# Patient Record
Sex: Female | Born: 1935
Health system: Southern US, Community
[De-identification: ages and names within clinical notes are randomized; demographics above are authoritative.]

## PROBLEM LIST (undated history)

## (undated) DIAGNOSIS — K219 Gastro-esophageal reflux disease without esophagitis: Secondary | ICD-10-CM

## (undated) DIAGNOSIS — E079 Disorder of thyroid, unspecified: Secondary | ICD-10-CM

## (undated) DIAGNOSIS — I10 Essential (primary) hypertension: Secondary | ICD-10-CM

## (undated) DIAGNOSIS — M1990 Unspecified osteoarthritis, unspecified site: Secondary | ICD-10-CM

## (undated) DIAGNOSIS — E785 Hyperlipidemia, unspecified: Secondary | ICD-10-CM

## (undated) HISTORY — DX: Gastro-esophageal reflux disease without esophagitis: K21.9

## (undated) HISTORY — PX: OTHER SURGICAL HISTORY: SHX169

## (undated) HISTORY — DX: Unspecified osteoarthritis, unspecified site: M19.90

## (undated) HISTORY — DX: Disorder of thyroid, unspecified: E07.9

## (undated) HISTORY — DX: Hyperlipidemia, unspecified: E78.5

## (undated) HISTORY — DX: Essential (primary) hypertension: I10

---

## 2001-01-21 ENCOUNTER — Encounter: Admission: RE | Admit: 2001-01-21 | Discharge: 2001-01-21 | Payer: Self-pay | Admitting: Internal Medicine

## 2001-01-21 ENCOUNTER — Encounter: Payer: Self-pay | Admitting: Internal Medicine

## 2001-12-22 ENCOUNTER — Encounter: Payer: Self-pay | Admitting: Internal Medicine

## 2001-12-22 ENCOUNTER — Encounter: Admission: RE | Admit: 2001-12-22 | Discharge: 2001-12-22 | Payer: Self-pay | Admitting: Internal Medicine

## 2006-01-08 ENCOUNTER — Encounter: Admission: RE | Admit: 2006-01-08 | Discharge: 2006-01-08 | Payer: Self-pay | Admitting: Internal Medicine

## 2007-06-09 ENCOUNTER — Encounter: Admission: RE | Admit: 2007-06-09 | Discharge: 2007-06-09 | Payer: Self-pay | Admitting: Internal Medicine

## 2007-09-02 ENCOUNTER — Encounter: Admission: RE | Admit: 2007-09-02 | Discharge: 2007-09-02 | Payer: Self-pay | Admitting: Internal Medicine

## 2008-11-29 ENCOUNTER — Ambulatory Visit: Payer: Self-pay | Admitting: Vascular Surgery

## 2010-02-24 ENCOUNTER — Encounter: Payer: Self-pay | Admitting: Internal Medicine

## 2012-12-08 ENCOUNTER — Ambulatory Visit (INDEPENDENT_AMBULATORY_CARE_PROVIDER_SITE_OTHER): Payer: Medicare Other | Admitting: Podiatrist

## 2012-12-08 ENCOUNTER — Encounter: Payer: Self-pay | Admitting: Podiatrist

## 2012-12-08 DIAGNOSIS — M79609 Pain in unspecified limb: Secondary | ICD-10-CM

## 2012-12-08 DIAGNOSIS — B351 Tinea unguium: Secondary | ICD-10-CM

## 2012-12-08 NOTE — Patient Instructions (Signed)
GENERAL FOOT HEALTH INFORMATION:  Moisturize your feet regularly with a cream based lotion such as Cetaphil (Cream) or Eucerin (Cream)- usually available in a tub or crock type of container.  Avoid applying the cream to the toe interspaces themselves to reduce the risk of a fungal infection between the toes.  After showering or bathing be sure to dry well between your toes.    Wear a good fitting shoe-  Running shoe brands I recommend are Brooks, New Balance and [employer]iics.  Always have a shoe fit specialist help you choose your shoes as there are many "varieties" of shoes and they can find you the best fit.  [employer]/ [employer] ([place], [place], ), [employer], [employer] ([place]) have trained staff to help you in this process.  The [employer] in [place] has a great selection of euro-comfort casual shoes with good comfort and support as well.    Watch your toenails for any signs of infection including drainage, pus redness or swelling along the sides of the toenails.  Soak in epsom salt water and use antibiotic ointment (OTC) if you notice this start to occur.  If the redness does not resolve within 2-3 days, call for an appointment to be seen.  

## 2012-12-08 NOTE — Progress Notes (Signed)
  HPI:  Patient presents today for follow up of foot and nail care. Denies any new complaints today. Requests new buttress pads for each foot  Objective:  Patients chart is reviewed.  Neurovascular status unchanged.  Patients nails are thickened, discolored, distrophic, friable and brittle with yellow-brown discoloration. Patient subjectively relates they are painful with shoes and with ambulation.both feet.  Great toenails are incurvated but not infected.    Assessment:  Symptomatic onychomycosis  Plan:  Discussed treatment options and alternatives.  The symptomatic toenails were debrided through manual an mechanical means without complication.  Return appointment recommended at routine intervals of 3 months.  Slant back procedures performed without anesthesia on bilateral halluces.  Marlowe Aschoff DPM

## 2013-03-09 ENCOUNTER — Encounter: Payer: Self-pay | Admitting: Podiatrist

## 2013-03-09 ENCOUNTER — Ambulatory Visit (INDEPENDENT_AMBULATORY_CARE_PROVIDER_SITE_OTHER): Payer: Medicare Other | Admitting: Podiatrist

## 2013-03-09 VITALS — BP 134/70 | HR 74 | Resp 16

## 2013-03-09 DIAGNOSIS — M79609 Pain in unspecified limb: Secondary | ICD-10-CM

## 2013-03-09 DIAGNOSIS — L600 Ingrowing nail: Secondary | ICD-10-CM

## 2013-03-09 DIAGNOSIS — B351 Tinea unguium: Secondary | ICD-10-CM

## 2013-03-09 NOTE — Patient Instructions (Signed)
SAS shoes are probably your best bet for good, comfortable shoes-- you can also wear your sandals at home.  The shoe market has the largest selection of comfortable shoes you may wish to try.

## 2013-03-09 NOTE — Progress Notes (Signed)
   HPI:  Patient presents today for follow up of foot and nail care. Complains of generalized arthritis type pain.  ulcer has completely healed at the distal digit right foot.    Objective:  Patients chart is reviewed.  Vascular status reveals pedal pulses noted at 1 out of 4 dp and pt bilateral .  Neurological sensation is Decreased to [facility]il[facility] bilateral.  Patients nails are thickened, discolored, distrophic, friable and brittle with yellow-brown discoloration. Patient subjectively relates they are painful with shoes and with ambulation of bilateral feet. Contracture of all digits is noted. Bilateral hallux nails are incurvated and ingrown medial and lateral nail borders, generalized arthritic discomfort is present.  Assessment:  Symptomatic onychomycosis on the hammertoe contracture bilateral, healed ulcer, arthritic pain  Plan:  Discussed treatment options and alternatives.  The symptomatic toenails were debrided through manual an mechanical means without complication. Dispensed a buttress pads for bilateral feet, recommended Voltaren gel, Return appointment recommended at routine intervals of 3 months    Marlowe AschoffKathryn Alwaleed Obeso, DPM

## 2013-06-09 ENCOUNTER — Ambulatory Visit (INDEPENDENT_AMBULATORY_CARE_PROVIDER_SITE_OTHER): Payer: Medicare Other | Admitting: Podiatrist

## 2013-06-09 DIAGNOSIS — L600 Ingrowing nail: Secondary | ICD-10-CM

## 2013-06-09 DIAGNOSIS — M79609 Pain in unspecified limb: Secondary | ICD-10-CM

## 2013-06-09 DIAGNOSIS — B351 Tinea unguium: Secondary | ICD-10-CM

## 2013-06-09 NOTE — Progress Notes (Signed)
HPI: Patient presents today for follow up of foot and nail care. Generalized arthritic type pain still present.  Objective: Patients chart is reviewed. Vascular status reveals pedal pulses noted at 1 out of 4 dp and pt bilateral . Neurological sensation is Decreased to [facility] monofilament bilateral. Patients nails are thickened, discolored, distrophic, friable and brittle with yellow-brown discoloration. Patient subjectively relates they are painful with shoes and with ambulation of bilateral feet. Contracture of all digits is noted. Bilateral hallux nails are incurvated and ingrown medial and lateral nail borders, generalized arthritic discomfort is present.  Assessment: Symptomatic onychomycosis on the hammertoe contracture bilateral,  arthritic pain  Plan: Discussed treatment options and alternatives. The symptomatic toenails were debrided through manual an mechanical means without complication.  Return appointment recommended at routine intervals of 3 months

## 2013-06-09 NOTE — Patient Instructions (Signed)

## 2013-09-15 ENCOUNTER — Ambulatory Visit (INDEPENDENT_AMBULATORY_CARE_PROVIDER_SITE_OTHER): Payer: Medicare Other | Admitting: Podiatrist

## 2013-09-15 DIAGNOSIS — M79609 Pain in unspecified limb: Secondary | ICD-10-CM

## 2013-09-15 DIAGNOSIS — M79676 Pain in unspecified toe(s): Principal | ICD-10-CM

## 2013-09-15 DIAGNOSIS — M2040 Other hammer toe(s) (acquired), unspecified foot: Secondary | ICD-10-CM

## 2013-09-15 DIAGNOSIS — L84 Corns and callosities: Secondary | ICD-10-CM

## 2013-09-15 DIAGNOSIS — B351 Tinea unguium: Secondary | ICD-10-CM

## 2013-09-15 NOTE — Progress Notes (Signed)
   HPI: Patient presents today for follow up of foot and nail care. Generalized arthritic type pain still present. Calluses at the tip of the left fourth and right third toe are present and symptomatic.  Objective: Patients chart is reviewed. Vascular status reveals pedal pulses noted at 1 out of 4 dp and pt bilateral . Neurological sensation is Decreased to [facility] monofilament bilateral. Patients nails are thickened, discolored, distrophic, friable and brittle with yellow-brown discoloration. Patient subjectively relates they are painful with shoes and with ambulation of bilateral feet. Contracture of all digits is noted. Bilateral hallux nails are incurvated and ingrown medial and lateral nail borders, generalized arthritic discomfort is present. Callouses present at the distal tip of the left fourth and right third toe. Hammertoe contracture is present bilateral.  Assessment: Symptomatic onychomycosis on the hammertoe contracture bilateral, arthritic pain   Plan: Discussed treatment options and alternatives. The symptomatic toenails were debrided through manual an mechanical means without complication. Debridement of the calluses at the tips of the toe was accomplished. Dispensed tube foam and buttress pads to help lift the toes off the ground. Return appointment recommended at routine intervals of 3 months

## 2013-12-15 ENCOUNTER — Ambulatory Visit (INDEPENDENT_AMBULATORY_CARE_PROVIDER_SITE_OTHER): Payer: Medicare Other | Admitting: Podiatrist

## 2013-12-15 DIAGNOSIS — M79676 Pain in unspecified toe(s): Secondary | ICD-10-CM

## 2013-12-15 DIAGNOSIS — B351 Tinea unguium: Secondary | ICD-10-CM

## 2013-12-15 NOTE — Progress Notes (Signed)
   HPI: Patient presents today for follow up of foot and nail care. Generalized arthritic type pain still present. Calluses at the tip of the left fourth and right third toe are present and symptomatic.  Objective: Patients chart is reviewed. Vascular status reveals pedal pulses noted at 1 out of 4 dp and pt bilateral . Neurological sensation is Decreased to [facility] monofilament bilateral. Patients nails are thickened, discolored, distrophic, friable and brittle with yellow-brown discoloration. Patient subjectively relates they are painful with shoes and with ambulation of bilateral feet. Contracture of all digits is noted. Bilateral hallux nails are incurvated and ingrown medial and lateral nail borders, generalized arthritic discomfort is present. Callouses present at the distal tip of the left fourth and right third toe. Hammertoe contracture is present bilateral.  Assessment: Symptomatic onychomycosis on the hammertoe contracture bilateral, arthritic pain   Plan: Discussed treatment options and alternatives. The symptomatic toenails were debrided through manual an mechanical means without complication. Calluses at the tips of the toe are much improved with the use of the leather buttress pads.  We are out of leather pads today and silicone ones are dispensed.  There is a discrepancy on the price of the pads therefore they were given to her as a courtesy. Return appointment recommended at routine intervals of 3 months

## 2014-03-16 ENCOUNTER — Encounter: Payer: Self-pay | Admitting: Podiatrist

## 2014-03-16 ENCOUNTER — Ambulatory Visit (INDEPENDENT_AMBULATORY_CARE_PROVIDER_SITE_OTHER): Payer: Medicare Other | Admitting: Podiatrist

## 2014-03-16 DIAGNOSIS — M79676 Pain in unspecified toe(s): Secondary | ICD-10-CM

## 2014-03-16 DIAGNOSIS — B351 Tinea unguium: Secondary | ICD-10-CM

## 2014-03-16 NOTE — Progress Notes (Signed)
   HPI: Patient presents today for follow up of foot and nail care. Generalized arthritic type pain still present. Calluses at the tip of the left fourth and right third toe are present and asymptomatic today.  Objective: Patients chart is reviewed. Vascular status reveals pedal pulses noted at 1 out of 4 dp and pt bilateral . Neurological sensation is Decreased to Semmes Weinstein monofilament bilateral. Patients nails are thickened, discolored, distrophic, friable and brittle with yellow-brown discoloration. Patient subjectively relates they are painful with shoes and with ambulation of bilateral feet. Contracture of all digits is noted. Bilateral hallux nails are incurvated and ingrown medial and lateral nail borders, generalized arthritic discomfort is present. Callouses present at the distal tip of the left fourth and right third toe. Hammertoe contracture is present bilateral.  Assessment: Symptomatic onychomycosis on the hammertoe contracture bilateral, arthritic pain   Plan: Discussed treatment options and alternatives. The symptomatic toenails were debrided through manual an mechanical means without complication. Calluses at the tips of the toe are much improved  Return appointment recommended at routine intervals of 3 months   

## 2014-06-15 ENCOUNTER — Ambulatory Visit: Payer: Medicare Other | Admitting: Podiatrist

## 2014-06-21 ENCOUNTER — Ambulatory Visit (INDEPENDENT_AMBULATORY_CARE_PROVIDER_SITE_OTHER): Payer: Medicare Other | Admitting: Podiatrist

## 2014-06-21 ENCOUNTER — Encounter: Payer: Self-pay | Admitting: Podiatrist

## 2014-06-21 VITALS — BP 126/54 | HR 75 | Resp 16

## 2014-06-21 DIAGNOSIS — M79676 Pain in unspecified toe(s): Secondary | ICD-10-CM

## 2014-06-21 DIAGNOSIS — B351 Tinea unguium: Secondary | ICD-10-CM | POA: Diagnosis not present

## 2014-06-23 NOTE — Progress Notes (Signed)
   HPI: Patient presents today for follow up of foot and nail care. Generalized arthritic type pain still present. Calluses at the tip of the left fourth and right third toe are present and asymptomatic today.  Objective: Patients chart is reviewed. Vascular status reveals pedal pulses noted at 1 out of 4 dp and pt bilateral . Neurological sensation is Decreased to [facility] monofilament bilateral. Patients nails are thickened, discolored, distrophic, friable and brittle with yellow-brown discoloration. Patient subjectively relates they are painful with shoes and with ambulation of bilateral feet. Contracture of all digits is noted. Bilateral hallux nails are incurvated and ingrown medial and lateral nail borders, generalized arthritic discomfort is present. Callouses present at the distal tip of the left fourth and right third toe. Hammertoe contracture is present bilateral.  Assessment: Symptomatic onychomycosis on the hammertoe contracture bilateral, arthritic pain   Plan: Discussed treatment options and alternatives. The symptomatic toenails were debrided through manual an mechanical means without complication. Calluses at the tips of the toe are much improved  Return appointment recommended at routine intervals of 3 months

## 2014-10-17 ENCOUNTER — Ambulatory Visit: Payer: Medicare Other | Admitting: Podiatrist

## 2014-10-20 ENCOUNTER — Encounter: Payer: Self-pay | Admitting: Podiatry

## 2014-10-20 ENCOUNTER — Ambulatory Visit (INDEPENDENT_AMBULATORY_CARE_PROVIDER_SITE_OTHER): Payer: Medicare Other | Admitting: Podiatry

## 2014-10-20 DIAGNOSIS — B351 Tinea unguium: Secondary | ICD-10-CM | POA: Diagnosis not present

## 2014-10-20 DIAGNOSIS — M79676 Pain in unspecified toe(s): Secondary | ICD-10-CM

## 2014-10-20 NOTE — Progress Notes (Signed)
Patient ID: Diana Peck, female   DOB: April 23, 1918, 81 y.o.   MRN: [id number] Complaint:  Visit Type: Patient returns to my office for continued preventative foot care services. Complaint: Patient states" my nails have grown long and thick and become painful to walk and wear shoes" . The patient presents for preventative foot care services. No changes to ROS  Podiatric Exam: Vascular: dorsalis pedis and posterior tibial pulses are palpable bilateral. Capillary return is immediate. Temperature gradient is WNL. Skin turgor WNL  Sensorium: Normal Semmes Weinstein monofilament test. Normal tactile sensation bilaterally. Nail Exam: Pt has thick disfigured discolored nails with subungual debris noted bilateral entire nail hallux through fifth toenails Ulcer Exam: There is no evidence of ulcer or pre-ulcerative changes or infection. Orthopedic Exam: Muscle tone and strength are WNL. No limitations in general ROM. No crepitus or effusions noted. Foot type and digits show no abnormalities. Bony prominences are unremarkable. Hammer toe contracture both feet. Skin: No Porokeratosis. No infection or ulcers  Diagnosis:  Onychomycosis, , Pain in right toe, pain in left toes  Treatment & Plan Procedures and Treatment: Consent by patient was obtained for treatment procedures. The patient understood the discussion of treatment and procedures well. All questions were answered thoroughly reviewed. Debridement of mycotic and hypertrophic toenails, 1 through 5 bilateral and clearing of subungual debris. No ulceration, no infection noted.  Return Visit-Office Procedure: Patient instructed to return to the office for a follow up visit 3 months for continued evaluation and treatment.

## 2015-01-11 ENCOUNTER — Ambulatory Visit (INDEPENDENT_AMBULATORY_CARE_PROVIDER_SITE_OTHER): Payer: Medicare Other | Admitting: Podiatry

## 2015-01-11 ENCOUNTER — Encounter: Payer: Self-pay | Admitting: Podiatry

## 2015-01-11 DIAGNOSIS — B351 Tinea unguium: Secondary | ICD-10-CM | POA: Diagnosis not present

## 2015-01-11 DIAGNOSIS — M79676 Pain in unspecified toe(s): Secondary | ICD-10-CM

## 2015-01-11 DIAGNOSIS — M2040 Other hammer toe(s) (acquired), unspecified foot: Secondary | ICD-10-CM

## 2015-01-11 NOTE — Progress Notes (Signed)
Patient ID: Diana Peck R Ebel, female   DOB: 07/21/1918, 81 y.o.   MRN: [id number] Complaint:  Visit Type: Patient returns to my office for continued preventative foot care services. Complaint: Patient states" my nails have grown long and thick and become painful to walk and wear shoes" . The patient presents for preventative foot care services. No changes to ROS  Podiatric Exam: Vascular: dorsalis pedis and posterior tibial pulses are palpable bilateral. Capillary return is immediate. Temperature gradient is WNL. Skin turgor WNL  Sensorium: Normal Semmes Weinstein monofilament test. Normal tactile sensation bilaterally. Nail Exam: Pt has thick disfigured discolored nails with subungual debris noted bilateral entire nail hallux through fifth toenails Ulcer Exam: There is no evidence of ulcer or pre-ulcerative changes or infection. Orthopedic Exam: Muscle tone and strength are WNL. No limitations in general ROM. No crepitus or effusions noted. Foot type and digits show no abnormalities. Bony prominences are unremarkable. Hammer toe contracture both feet. Skin: No Porokeratosis. No infection or ulcers  Diagnosis:  Onychomycosis, , Pain in right toe, pain in left toes  Treatment & Plan Procedures and Treatment: Consent by patient was obtained for treatment procedures. The patient understood the discussion of treatment and procedures well. All questions were answered thoroughly reviewed. Debridement of mycotic and hypertrophic toenails, 1 through 5 bilateral and clearing of subungual debris. No ulceration, no infection noted.  Return Visit-Office Procedure: Patient instructed to return to the office for a follow up visit 3 months for continued evaluation and treatment.  Helane GuntherGregory Olivianna Higley DPM

## 2015-04-12 ENCOUNTER — Ambulatory Visit: Payer: Medicare Other | Admitting: Podiatry

## 2015-07-25 ENCOUNTER — Emergency Department (HOSPITAL_BASED_OUTPATIENT_CLINIC_OR_DEPARTMENT_OTHER)
Admission: EM | Admit: 2015-07-25 | Discharge: 2015-07-25 | Disposition: A | Discharge status: Home / Self Care | Payer: Medicare Other | Attending: Emergency Medicine | Admitting: Emergency Medicine

## 2015-07-25 ENCOUNTER — Emergency Department (HOSPITAL_BASED_OUTPATIENT_CLINIC_OR_DEPARTMENT_OTHER): Payer: Medicare Other

## 2015-07-25 DIAGNOSIS — M25551 Pain in right hip: Secondary | ICD-10-CM | POA: Diagnosis present

## 2015-07-25 DIAGNOSIS — Z79899 Other long term (current) drug therapy: Secondary | ICD-10-CM | POA: Insufficient documentation

## 2015-07-25 DIAGNOSIS — Z791 Long term (current) use of non-steroidal anti-inflammatories (NSAID): Secondary | ICD-10-CM | POA: Insufficient documentation

## 2015-07-25 DIAGNOSIS — E785 Hyperlipidemia, unspecified: Secondary | ICD-10-CM | POA: Insufficient documentation

## 2015-07-25 DIAGNOSIS — M1990 Unspecified osteoarthritis, unspecified site: Secondary | ICD-10-CM | POA: Diagnosis not present

## 2015-07-25 DIAGNOSIS — I10 Essential (primary) hypertension: Secondary | ICD-10-CM | POA: Diagnosis not present

## 2015-07-25 DIAGNOSIS — N390 Urinary tract infection, site not specified: Secondary | ICD-10-CM | POA: Insufficient documentation

## 2015-07-25 DIAGNOSIS — R0602 Shortness of breath: Secondary | ICD-10-CM | POA: Insufficient documentation

## 2015-07-25 LAB — BASIC METABOLIC PANEL
ANION GAP: 9 (ref 5–15)
BUN: 40 mg/dL — ABNORMAL HIGH (ref 6–20)
CALCIUM: 8.6 mg/dL — AB (ref 8.9–10.3)
CO2: 23 mmol/L (ref 22–32)
Chloride: 108 mmol/L (ref 101–111)
Creatinine, Ser: 1.31 mg/dL — ABNORMAL HIGH (ref 0.44–1.00)
GFR calc non Af Amer: 34 mL/min — ABNORMAL LOW (ref >60)
GFR, EST AFRICAN AMERICAN: 39 mL/min — AB (ref >60)
GLUCOSE: 95 mg/dL (ref 65–99)
POTASSIUM: 4 mmol/L (ref 3.5–5.1)
Sodium: 140 mmol/L (ref 135–145)

## 2015-07-25 LAB — CBC WITH DIFFERENTIAL/PLATELET
BASOS ABS: 0 10*3/uL (ref 0.0–0.1)
BASOS PCT: 0 %
EOS PCT: 1 %
Eosinophils Absolute: 0.1 10*3/uL (ref 0.0–0.7)
HEMATOCRIT: 38.5 % (ref 36.0–46.0)
Hemoglobin: 12.5 g/dL (ref 12.0–15.0)
LYMPHS PCT: 21 %
Lymphs Abs: 2.5 10*3/uL (ref 0.7–4.0)
MCH: 30.3 pg (ref 26.0–34.0)
MCHC: 32.5 g/dL (ref 30.0–36.0)
MCV: 93.4 fL (ref 78.0–100.0)
MONOS PCT: 5 %
Monocytes Absolute: 0.6 10*3/uL (ref 0.1–1.0)
NEUTROS ABS: 8.7 10*3/uL — AB (ref 1.7–7.7)
NEUTROS PCT: 73 %
PLATELETS: 182 10*3/uL (ref 150–400)
RBC: 4.12 MIL/uL (ref 3.87–5.11)
RDW: 16 % — AB (ref 11.5–15.5)
WBC: 11.9 10*3/uL — ABNORMAL HIGH (ref 4.0–10.5)

## 2015-07-25 LAB — URINALYSIS, ROUTINE W REFLEX MICROSCOPIC
Bilirubin Urine: NEGATIVE
Glucose, UA: NEGATIVE mg/dL
Hgb urine dipstick: NEGATIVE
KETONES UR: NEGATIVE mg/dL
NITRITE: NEGATIVE
PROTEIN: NEGATIVE mg/dL
Specific Gravity, Urine: 1.022 (ref 1.005–1.030)
pH: 5.5 (ref 5.0–8.0)

## 2015-07-25 LAB — URINE MICROSCOPIC-ADD ON

## 2015-07-25 MED ORDER — ONDANSETRON 8 MG PO TBDP
8.0000 mg | ORAL_TABLET | Freq: Once | ORAL | Status: AC
Start: 2015-07-25 — End: 2015-07-25
  Administered 2015-07-25: 8 mg via ORAL
  Filled 2015-07-25: qty 1

## 2015-07-25 MED ORDER — HYDROCODONE-ACETAMINOPHEN 5-325 MG PO TABS
0.5000 | ORAL_TABLET | Freq: Once | ORAL | Status: AC
  Administered 2015-07-25: 0.5 via ORAL
  Filled 2015-07-25: qty 1

## 2015-07-25 MED ORDER — CEPHALEXIN 500 MG PO CAPS: 500.0000 mg | ORAL_CAPSULE | Freq: Two times a day (BID) | ORAL | Status: DC

## 2015-07-25 MED ORDER — ONDANSETRON 4 MG PO TBDP: 4.0000 mg | ORAL_TABLET | Freq: Three times a day (TID) | ORAL | Status: AC | PRN

## 2015-07-25 MED ORDER — HYDROCODONE-ACETAMINOPHEN 5-325 MG PO TABS
0.5000 | ORAL_TABLET | Freq: Every evening | ORAL | Status: AC | PRN
Start: 2015-07-25 — End: ?

## 2015-07-25 MED FILL — CEPHALEXIN 500 MG CAPSULE: 500 | 7 days supply | Qty: 14 | Fill #0

## 2015-07-25 MED FILL — ONDANSETRON ODT 4 MG TABLET: 4 | 2 days supply | Qty: 4 | Fill #0

## 2015-07-25 MED FILL — HYDROCODON-APAP 5-325: 5-325 | 10 days supply | Qty: 5 | Fill #0

## 2015-07-25 NOTE — ED Notes (Addendum)
Per granddaughter-patient c/o chronic right hip pain for months but states worsening over the past 2 weeks.  Patients right leg noted to be longer than the left.  Per granddaughter no falls in the past few weeks.  Patient currently ambulates with cane.  Per daughter patient did fall on the left leg years ago and has been limping for "a long time".

## 2015-07-25 NOTE — ED Notes (Signed)
Pt EMS transport from home. C/o right hip pain for "weeks". Pain is worsening. Denies falls or injury

## 2015-07-25 NOTE — ED Notes (Signed)
MD at bedside. 

## 2015-07-25 NOTE — Discharge Instructions (Signed)
  Arthritis Arthritis means joint pain. It can also mean joint disease. A joint is a place where bones come together. People who have arthritis may have:  Red joints.  Swollen joints.  Stiff joints.  Warm joints.  A fever.  A feeling of being sick. HOME CARE Pay attention to any changes in your symptoms. Take these actions to help with your pain and swelling. Medicines  Take over-the-counter and prescription medicines only as told by your doctor.  Do not take aspirin for pain if your doctor says that you may have gout. Activities  Rest your joint if your doctor tells you to.  Avoid activities that make the pain worse.  Exercise your joint regularly as told by your doctor. Try doing exercises like:  Swimming.  Water aerobics.  Biking.  Walking. Joint Care  If your joint is swollen, keep it raised (elevated) if told by your doctor.  If your joint feels stiff in the morning, try taking a warm shower.  If you have diabetes, do not apply heat without asking your doctor.  If told, apply heat to the joint:  Put a towel between the joint and the hot pack or heating pad.  Leave the heat on the area for 20-30 minutes.  If told, apply ice to the joint:  Put ice in a plastic bag.  Place a towel between your skin and the bag.  Leave the ice on for 20 minutes, 2-3 times per day.  Keep all follow-up visits as told by your doctor. GET HELP IF:  The pain gets worse.  You have a fever. GET HELP RIGHT AWAY IF:  You have very bad pain in your joint.  You have swelling in your joint.  Your joint is red.  Many joints become painful and swollen.  You have very bad back pain.  Your leg is very weak.  You cannot control your pee (urine) or poop (stool).   This information is not intended to replace advice given to you by your health care provider. Make sure you discuss any questions you have with your health care provider.   Document Released: 04/16/2009  Document Revised: 10/11/2014 Document Reviewed: 04/17/2014 Elsevier Interactive Patient Education 2016 [employer]  

## 2015-07-25 NOTE — ED Notes (Signed)
Patient states unable to provide urine sample at present.  Patient given water to drink.  MD aware.

## 2015-07-25 NOTE — ED Provider Notes (Signed)
[facility]   Zadie Rhineonald Aarna Mihalko, MD 07/25/15 1556

## 2015-07-25 NOTE — ED Notes (Signed)
Pt stated she felt fine walking around the department, but seemed she was short of breath.

## 2015-07-25 NOTE — ED Provider Notes (Signed)
CSN: [id number]     Arrival date & time 07/25/15  1323 History   First MD Initiated Contact with Patient 07/25/15 1340     Chief Complaint  Patient presents with  . Hip Pain    Patient is a 82 y.o. female presenting with hip pain. The history is provided by the patient and a relative.  Hip Pain This is a recurrent problem. The current episode started more than 1 week ago. The problem occurs daily. The problem has been gradually worsening. Associated symptoms include shortness of breath. Pertinent negatives include no chest pain and no abdominal pain. The symptoms are aggravated by walking. The symptoms are relieved by rest.  Patient presents with recurrent right hip pain This has been present for "awhile" - she reports h/o arthritis No falls/trauma She reports hip pain is worsening over past 2 weeks She also reports low back pain as well She also reports some cough/wheeze/sob No abd pain No CP No dysuria but she reports urinary frequency   Past Medical History  Diagnosis Date  . Arthritis   . GERD (gastroesophageal reflux disease)   . Thyroid disease   . Hyperlipidemia   . Hypertension    Past Surgical History  Procedure Laterality Date  . Tonsills     No family history on file. Social History  Substance Use Topics  . Smoking status: Never Smoker   . Smokeless tobacco: Former Neurosurgeon    Types: Snuff  . Alcohol Use: No   OB History    No data available     Review of Systems  Constitutional: Negative for fever.  Respiratory: Positive for cough and shortness of breath.   Cardiovascular: Negative for chest pain.  Gastrointestinal: Negative for abdominal pain.  Musculoskeletal: Positive for back pain and arthralgias.  All other systems reviewed and are negative.     Allergies  Codeine; Detrol; and E-mycin  Home Medications   Prior to Admission medications   Medication Sig Start Date End Date Taking? Authorizing Provider  diclofenac (VOLTAREN) 75 MG EC tablet  Take 75 mg by mouth 2 (two) times daily.   Yes Historical Provider, MD  diclofenac sodium (VOLTAREN) 1 % GEL Apply topically 4 (four) times daily.   Yes Historical Provider, MD  esomeprazole (NEXIUM) 40 MG capsule Take 40 mg by mouth daily at 12 noon.   Yes Historical Provider, MD  levothyroxine (SYNTHROID, LEVOTHROID) 50 MCG tablet  09/14/12  Yes Historical Provider, MD  LIPITOR 10 MG tablet  09/14/12  Yes Historical Provider, MD  triamcinolone cream (KENALOG) 0.1 % Apply 1 application topically 2 (two) times daily.   Yes Historical Provider, MD  triamterene-hydrochlorothiazide (MAXZIDE) 75-50 MG per tablet Take 1 tablet by mouth daily.   Yes Historical Provider, MD  Multiple Vitamin (MULTIVITAMIN) capsule Take 1 capsule by mouth daily.    Historical Provider, MD   BP 132/52 mmHg  Pulse 72  Temp(Src) 98 F (36.7 C) (Oral)  Resp 18  Ht  (1.575 m)  Wt 61.236 kg  BMI 24.69 kg/m2  SpO2 97% Physical Exam CONSTITUTIONAL: Frail, elderly, no acute distress HEAD: Normocephalic/atraumatic EYES: EOMI ENMT: Mucous membranes moist NECK: supple no meningeal signs SPINE/BACK:Kyphotic spine, mild paraspinal lumbar tenderness CV: S1/S2 noted LUNGS: Lungs are clear to auscultation bilaterally, no apparent distress ABDOMEN: soft, nontender GU:no cva tenderness NEURO: Pt is awake/alert/appropriate, moves all extremitiesx4.  No facial droop.   EXTREMITIES: pulses normal/equal, full ROM, mild tenderness to palpation of right hip.  No deformity noted. SKIN:  warm, color normal PSYCH: no abnormalities of mood noted, alert and oriented to situation  ED Course  Procedures   3:48 PM Pt improved She is ambulatory at baseline Imaging negative for fracture No falls reported She appears at baseline Discussed CXR findings with patient/family - findings could be from hiatal hernia but would need further definitive imaging.  She has no dysphagia or issues eating, will f/u with dr Su Hilt if any dysphagia  occurs Awaiting urinalysis and then will d/c home She did well with half tablet of vicodin will give as a prescription and need to stop volateran as mild renal insufficiency noted  Labs Review Labs Reviewed  BASIC METABOLIC PANEL - Abnormal; Notable for the following:    BUN 40 (*)    Creatinine, Ser 1.31 (*)    Calcium 8.6 (*)    GFR calc non Af Amer 34 (*)    GFR calc Af Amer 39 (*)    All other components within normal limits  CBC WITH DIFFERENTIAL/PLATELET - Abnormal; Notable for the following:    WBC 11.9 (*)    RDW 16.0 (*)    Neutro Abs 8.7 (*)    All other components within normal limits  URINALYSIS, ROUTINE W REFLEX MICROSCOPIC (NOT AT [facility])    Imaging Review Dg Chest 2 View  07/25/2015  CLINICAL DATA:  Low back and right hip pain for 2 weeks. No known injury. EXAM: CHEST  2 VIEW COMPARISON:  Chest radiograph 09/02/2007. FINDINGS: The heart size and mediastinal contours are grossly stable. There is aortic atherosclerosis with a moderate size hiatal hernia. The esophagus is dilated with an air-fluid level superior to the aortic arch. There is mild chronic scarring or atelectasis at both lung bases. No edema, confluent airspace opacity or significant pleural effusion. There are degenerative changes throughout the thoracic spine with anterior wedging near the thoracolumbar junction, grossly stable. IMPRESSION: 1. Distended esophagus with proximal air-fluid level worrisome for esophageal obstruction or dysmotility. Patient does have a moderate size hiatal hernia. Consider CT for further evaluation. If the patient has dysphagia, barium swallow may be helpful. 2. Chronic bibasilar scarring or atelectasis. Electronically Signed   By: Carey Bullocks M.D.   On: 07/25/2015 15:01   Dg Lumbar Spine Complete  07/25/2015  CLINICAL DATA:  Status post fall 2 weeks ago. Low back pain. Initial encounter. EXAM: LUMBAR SPINE - COMPLETE 4+ VIEW COMPARISON:  None. FINDINGS: No acute abnormality is  seen. The patient has severe multilevel degenerative disc disease and facet arthropathy. Convex left scoliosis is noted. Aortic atherosclerosis is seen. IMPRESSION: No acute abnormality. Severe multilevel spondylosis. Atherosclerosis. Electronically Signed   By: Drusilla Kanner M.D.   On: 07/25/2015 14:58   Dg Hip Unilat With Pelvis 2-3 Views Right  07/25/2015  CLINICAL DATA:  Status post fall 2 weeks ago. Continued right hip pain. Initial encounter. EXAM: DG HIP (WITH OR WITHOUT PELVIS) 2-3V RIGHT COMPARISON:  None. FINDINGS: No acute bony or joint abnormality is identified. No notable degenerative disease about the hips. Lower lumbar spondylosis is seen. IMPRESSION: No acute abnormality. Lower lumbar spondylosis. Electronically Signed   By: Drusilla Kanner M.D.   On: 07/25/2015 14:56   I have personally reviewed and evaluated these images and lab results as part of my medical decision-making.   MDM   Final diagnoses:  Right hip pain  Arthritis    Nursing notes including past medical history and social history reviewed and considered in documentation Labs/vital reviewed myself and considered during evaluation  xrays/imaging reviewed by myself and considered during evaluation     Zadie Rhineonald Brenson Hartman, MD 07/25/15 1550

## 2015-07-27 LAB — URINE CULTURE

## 2015-07-28 ENCOUNTER — Telehealth (HOSPITAL_BASED_OUTPATIENT_CLINIC_OR_DEPARTMENT_OTHER): Payer: Self-pay

## 2015-07-28 NOTE — Telephone Encounter (Signed)
Post ED Visit - Positive Culture Follow-up  Culture report reviewed by antimicrobial stewardship pharmacist:  []  Enzo BiNathan Batchelder, Pharm.D. []  Celedonio MiyamotoJeremy Frens, Pharm.D., BCPS []  Garvin FilaMike Maccia, Pharm.D. []  Georgina PillionElizabeth Martin, Pharm.D., BCPS []  MissionMinh Pham, [address]Ph[address]D., BCPS, AAHIVP []  Estella HuskMichelle Turner, Pharm.D., BCPS, AAHIVP []  Tennis Mustassie Stewart, Pharm.D. []  Sherle Poeob Vincent, [address]Ph[address] Allsion Masters Pharm D Positive urine culture Treated with Cephalexin, organism sensitive to the same and no further patient follow-up is required at this time.  Jerry CarasCullom, Keithan Dileonardo Burnett 07/28/2015, 11:07 AM

## 2015-11-14 ENCOUNTER — Encounter: Payer: Self-pay | Admitting: Podiatry

## 2015-11-14 ENCOUNTER — Ambulatory Visit (INDEPENDENT_AMBULATORY_CARE_PROVIDER_SITE_OTHER): Payer: Medicare Other | Admitting: Podiatry

## 2015-11-14 VITALS — BP 138/77 | HR 81 | Resp 14

## 2015-11-14 DIAGNOSIS — B351 Tinea unguium: Secondary | ICD-10-CM

## 2015-11-14 DIAGNOSIS — M79676 Pain in unspecified toe(s): Secondary | ICD-10-CM | POA: Diagnosis not present

## 2015-11-14 NOTE — Progress Notes (Signed)
Patient ID: Diana Peck, female   DOB: 10/20/1918, 82 y.o.   MRN: [id number] Complaint:  Visit Type: Patient returns to my office for continued preventative foot care services. Complaint: Patient states" my nails have grown long and thick and become painful to walk and wear shoes" . The patient presents for preventative foot care services. No changes to ROS  Podiatric Exam: Vascular: dorsalis pedis and posterior tibial pulses are palpable bilateral. Capillary return is immediate. Temperature gradient is WNL. Skin turgor WNL  Sensorium: Normal Semmes Weinstein monofilament test. Normal tactile sensation bilaterally. Nail Exam: Pt has thick disfigured discolored nails with subungual debris noted bilateral entire nail hallux through fifth toenails Ulcer Exam: There is no evidence of ulcer or pre-ulcerative changes or infection. Orthopedic Exam: Muscle tone and strength are WNL. No limitations in general ROM. No crepitus or effusions noted. Foot type and digits show no abnormalities. Bony prominences are unremarkable. Hammer toe contracture both feet. Skin: No Porokeratosis. No infection or ulcers  Diagnosis:  Onychomycosis, , Pain in right toe, pain in left toes  Treatment & Plan Procedures and Treatment: Consent by patient was obtained for treatment procedures. The patient understood the discussion of treatment and procedures well. All questions were answered thoroughly reviewed. Debridement of mycotic and hypertrophic toenails, 1 through 5 bilateral and clearing of subungual debris. No ulceration, no infection noted.  Return Visit-Office Procedure: Patient instructed to return to the office for a follow up visit 3 months for continued evaluation and treatment.  Jaedan Schuman DPM 

## 2016-02-20 ENCOUNTER — Ambulatory Visit: Payer: Medicare Other | Admitting: Podiatry

## 2016-06-04 ENCOUNTER — Encounter: Payer: Self-pay | Admitting: Podiatry

## 2016-06-04 ENCOUNTER — Ambulatory Visit (INDEPENDENT_AMBULATORY_CARE_PROVIDER_SITE_OTHER): Payer: Medicare Other | Admitting: Podiatry

## 2016-06-04 DIAGNOSIS — M79676 Pain in unspecified toe(s): Secondary | ICD-10-CM | POA: Diagnosis not present

## 2016-06-04 DIAGNOSIS — B351 Tinea unguium: Secondary | ICD-10-CM

## 2016-06-04 NOTE — Progress Notes (Signed)
Patient ID: Diana Peck, female   DOB: 08/06/1918, 83 y.o.   MRN: [id number] Complaint:  Visit Type: Patient returns to my office for continued preventative foot care services. Complaint: Patient states" my nails have grown long and thick and become painful to walk and wear shoes" . The patient presents for preventative foot care services. No changes to ROS  Podiatric Exam: Vascular: dorsalis pedis and posterior tibial pulses are palpable bilateral. Capillary return is immediate. Temperature gradient is WNL. Skin turgor WNL  Sensorium: Normal Semmes Weinstein monofilament test. Normal tactile sensation bilaterally. Nail Exam: Pt has thick disfigured discolored nails with subungual debris noted bilateral entire nail hallux through fifth toenails Ulcer Exam: There is no evidence of ulcer or pre-ulcerative changes or infection. Orthopedic Exam: Muscle tone and strength are WNL. No limitations in general ROM. No crepitus or effusions noted. Foot type and digits show no abnormalities. Bony prominences are unremarkable. Hammer toe contracture both feet. Skin: No Porokeratosis. No infection or ulcers  Diagnosis:  Onychomycosis, , Pain in right toe, pain in left toes  Treatment & Plan Procedures and Treatment: Consent by patient was obtained for treatment procedures. The patient understood the discussion of treatment and procedures well. All questions were answered thoroughly reviewed. Debridement of mycotic and hypertrophic toenails, 1 through 5 bilateral and clearing of subungual debris. No ulceration, no infection noted.  Return Visit-Office Procedure: Patient instructed to return to the office for a follow up visit 3 months for continued evaluation and treatment.  Tuleen Mandelbaum DPM 

## 2016-09-10 ENCOUNTER — Ambulatory Visit (INDEPENDENT_AMBULATORY_CARE_PROVIDER_SITE_OTHER): Payer: Medicare Other | Admitting: Podiatry

## 2016-09-10 DIAGNOSIS — M79676 Pain in unspecified toe(s): Secondary | ICD-10-CM | POA: Diagnosis not present

## 2016-09-10 DIAGNOSIS — B351 Tinea unguium: Secondary | ICD-10-CM

## 2016-09-10 NOTE — Progress Notes (Signed)
Patient ID: Diana Peck, female   DOB: 03/19/1918, 83 y.o.   MRN: [id number] Complaint:  Visit Type: Patient returns to my office for continued preventative foot care services. Complaint: Patient states" my nails have grown long and thick and become painful to walk and wear shoes" . The patient presents for preventative foot care services. No changes to ROS  Podiatric Exam: Vascular: dorsalis pedis and posterior tibial pulses are palpable bilateral. Capillary return is immediate. Temperature gradient is WNL. Skin turgor WNL  Sensorium: Normal Semmes Weinstein monofilament test. Normal tactile sensation bilaterally. Nail Exam: Pt has thick disfigured discolored nails with subungual debris noted bilateral entire nail hallux through fifth toenails Ulcer Exam: There is no evidence of ulcer or pre-ulcerative changes or infection. Orthopedic Exam: Muscle tone and strength are WNL. No limitations in general ROM. No crepitus or effusions noted. Foot type and digits show no abnormalities. Bony prominences are unremarkable. Hammer toe contracture both feet. Skin: No Porokeratosis. No infection or ulcers  Diagnosis:  Onychomycosis, , Pain in right toe, pain in left toes  Treatment & Plan Procedures and Treatment: Consent by patient was obtained for treatment procedures. The patient understood the discussion of treatment and procedures well. All questions were answered thoroughly reviewed. Debridement of mycotic and hypertrophic toenails, 1 through 5 bilateral and clearing of subungual debris. No ulceration, no infection noted.  Return Visit-Office Procedure: Patient instructed to return to the office for a follow up visit 3 months for continued evaluation and treatment.  Dalon Reichart DPM 

## 2016-12-17 ENCOUNTER — Encounter: Payer: Self-pay | Admitting: Podiatry

## 2016-12-17 ENCOUNTER — Ambulatory Visit (INDEPENDENT_AMBULATORY_CARE_PROVIDER_SITE_OTHER): Payer: Medicare Other | Admitting: Podiatry

## 2016-12-17 DIAGNOSIS — M79676 Pain in unspecified toe(s): Secondary | ICD-10-CM

## 2016-12-17 DIAGNOSIS — B351 Tinea unguium: Secondary | ICD-10-CM

## 2016-12-17 NOTE — Progress Notes (Signed)
Patient ID: Diana Peck, female   DOB: 07-19-1918, 83 y.o.   MRN: [id number] Complaint:  Visit Type: Patient returns to my office for continued preventative foot care services. Complaint: Patient states" my nails have grown long and thick and become painful to walk and wear shoes" . The patient presents for preventative foot care services. No changes to ROS  Podiatric Exam: Vascular: dorsalis pedis and posterior tibial pulses are palpable bilateral. Capillary return is immediate. Temperature gradient is WNL. Skin turgor WNL  Sensorium: Normal Semmes Weinstein monofilament test. Normal tactile sensation bilaterally. Nail Exam: Pt has thick disfigured discolored nails with subungual debris noted bilateral entire nail hallux through fifth toenails Ulcer Exam: There is no evidence of ulcer or pre-ulcerative changes or infection. Orthopedic Exam: Muscle tone and strength are WNL. No limitations in general ROM. No crepitus or effusions noted. Foot type and digits show no abnormalities. Bony prominences are unremarkable. Hammer toe contracture both feet. Skin: No Porokeratosis. No infection or ulcers  Diagnosis:  Onychomycosis, , Pain in right toe, pain in left toes  Treatment & Plan Procedures and Treatment: Consent by patient was obtained for treatment procedures. The patient understood the discussion of treatment and procedures well. All questions were answered thoroughly reviewed. Debridement of mycotic and hypertrophic toenails, 1 through 5 bilateral and clearing of subungual debris. No ulceration, no infection noted.  Return Visit-Office Procedure: Patient instructed to return to the office for a follow up visit 3 months for continued evaluation and treatment.  Helane GuntherGregory Elizet Kaplan DPM

## 2017-04-01 ENCOUNTER — Encounter: Payer: Self-pay | Admitting: Podiatry

## 2017-04-01 ENCOUNTER — Ambulatory Visit (INDEPENDENT_AMBULATORY_CARE_PROVIDER_SITE_OTHER): Payer: Medicare Other | Admitting: Podiatry

## 2017-04-01 DIAGNOSIS — B351 Tinea unguium: Secondary | ICD-10-CM | POA: Diagnosis not present

## 2017-04-01 DIAGNOSIS — M79676 Pain in unspecified toe(s): Secondary | ICD-10-CM

## 2017-04-01 NOTE — Progress Notes (Signed)
Patient ID: Diana Peck, female   DOB: 07/03/1918, 84 y.o.   MRN: [id number] Complaint:  Visit Type: Patient returns to my office for continued preventative foot care services. Complaint: Patient states" my nails have grown long and thick and become painful to walk and wear shoes" . The patient presents for preventative foot care services. No changes to ROS  Podiatric Exam: Vascular: dorsalis pedis and posterior tibial pulses are palpable bilateral. Capillary return is immediate. Temperature gradient is WNL. Skin turgor WNL  Sensorium: Normal Semmes Weinstein monofilament test. Normal tactile sensation bilaterally. Nail Exam: Pt has thick disfigured discolored nails with subungual debris noted bilateral entire nail hallux through fifth toenails Ulcer Exam: There is no evidence of ulcer or pre-ulcerative changes or infection. Orthopedic Exam: Muscle tone and strength are WNL. No limitations in general ROM. No crepitus or effusions noted. Foot type and digits show no abnormalities. Bony prominences are unremarkable. Hammer toe contracture both feet. Skin: No Porokeratosis. No infection or ulcers  Diagnosis:  Onychomycosis, , Pain in right toe, pain in left toes  Treatment & Plan Procedures and Treatment: Consent by patient was obtained for treatment procedures. The patient understood the discussion of treatment and procedures well. All questions were answered thoroughly reviewed. Debridement of mycotic and hypertrophic toenails, 1 through 5 bilateral and clearing of subungual debris. No ulceration, no infection noted.  Return Visit-Office Procedure: Patient instructed to return to the office for a follow up visit 3 months for continued evaluation and treatment.  Apollonia Amini DPM 

## 2017-04-21 IMAGING — DX DG LUMBAR SPINE COMPLETE 4+V
5 series · 5 of 5 positions shown · non-contrast
Comparison: None.

CLINICAL DATA: Status post fall 2 weeks ago. Low back pain. Initial
encounter.

EXAM:
LUMBAR SPINE - COMPLETE 4+ VIEW

[l-spine ap]
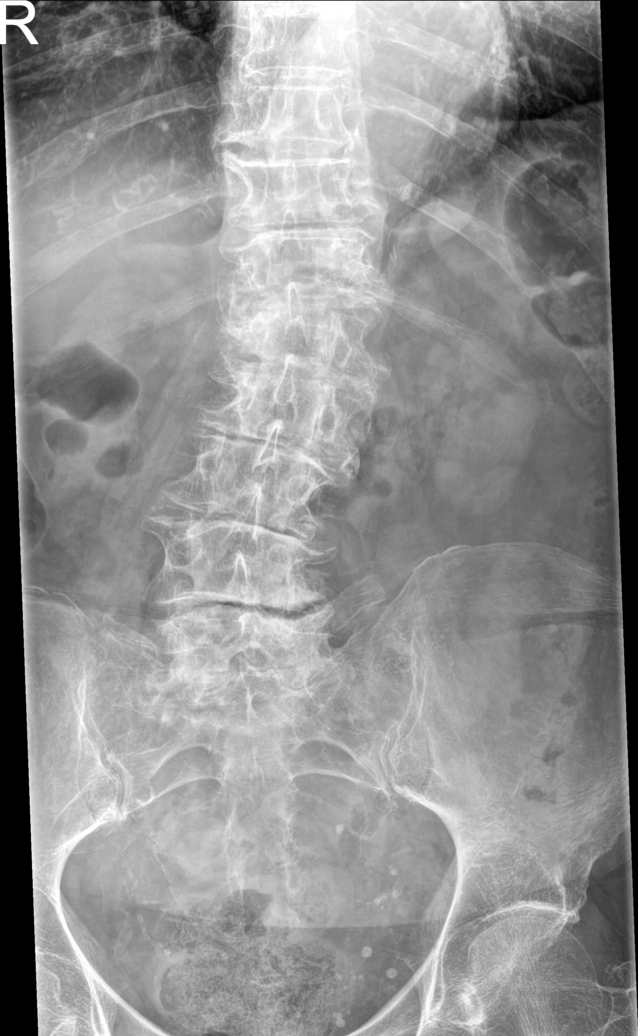

[l-spine obl (1 of 2)]
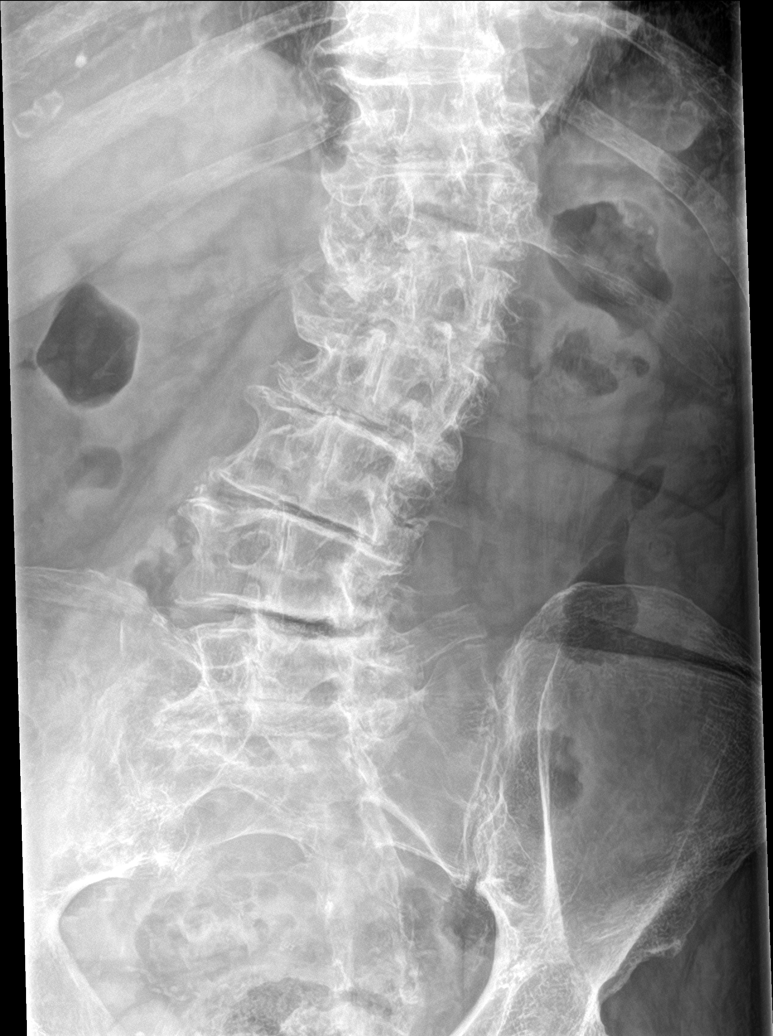

[l-spine obl (2 of 2)]
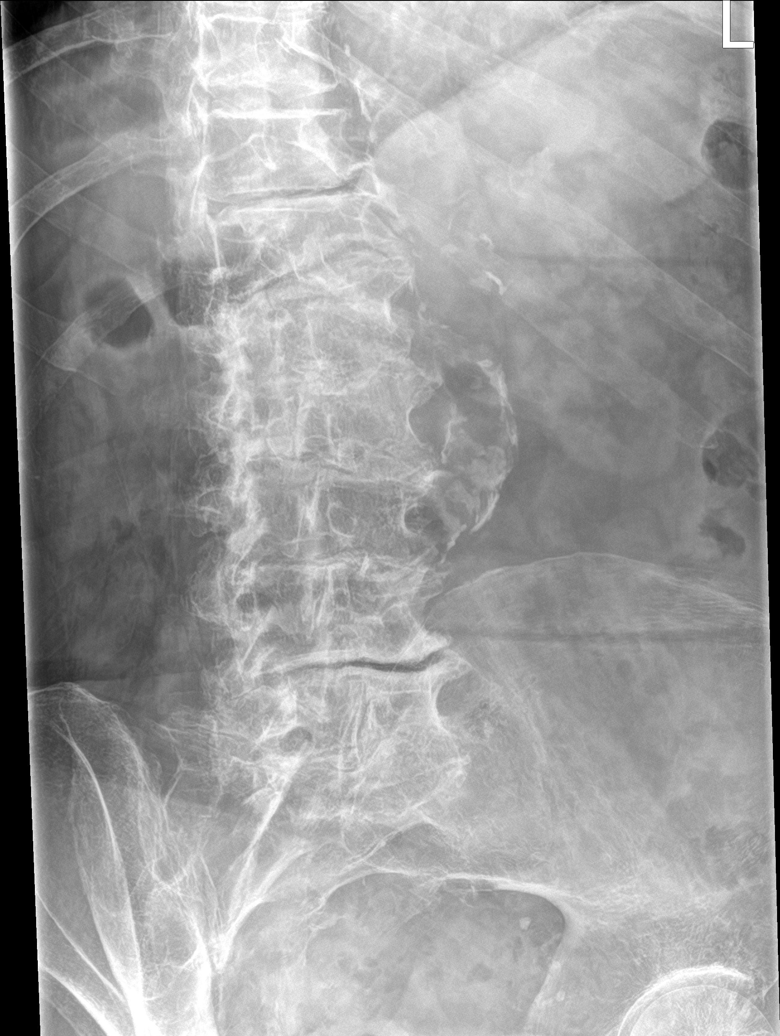

[l-spine lat]
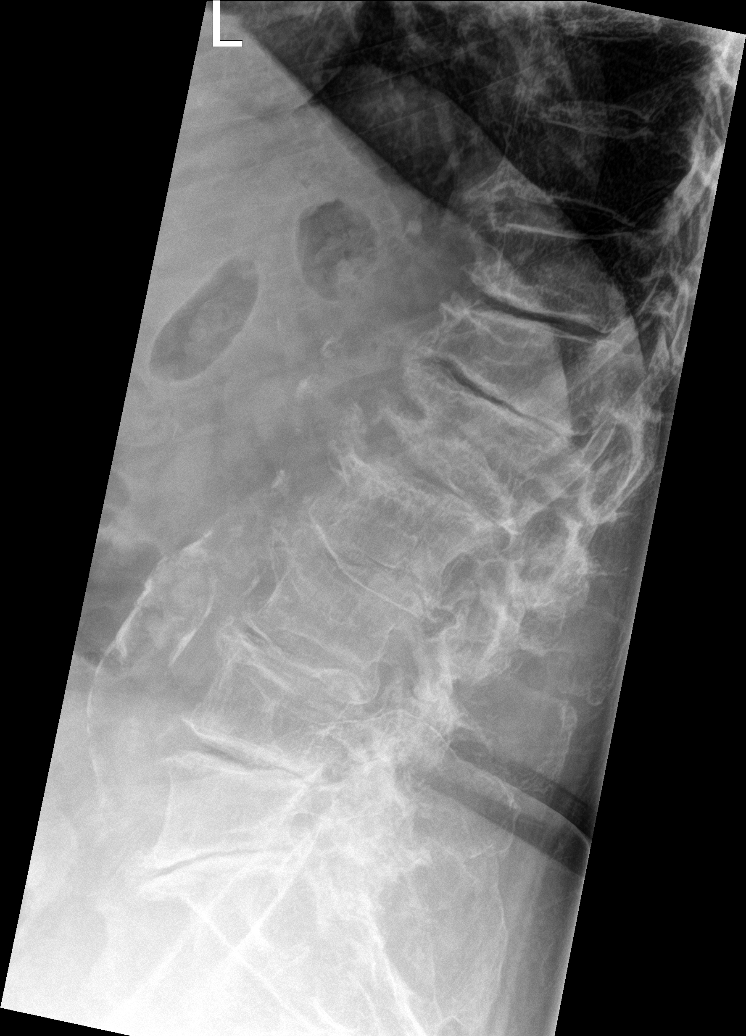

[l-spine spot]
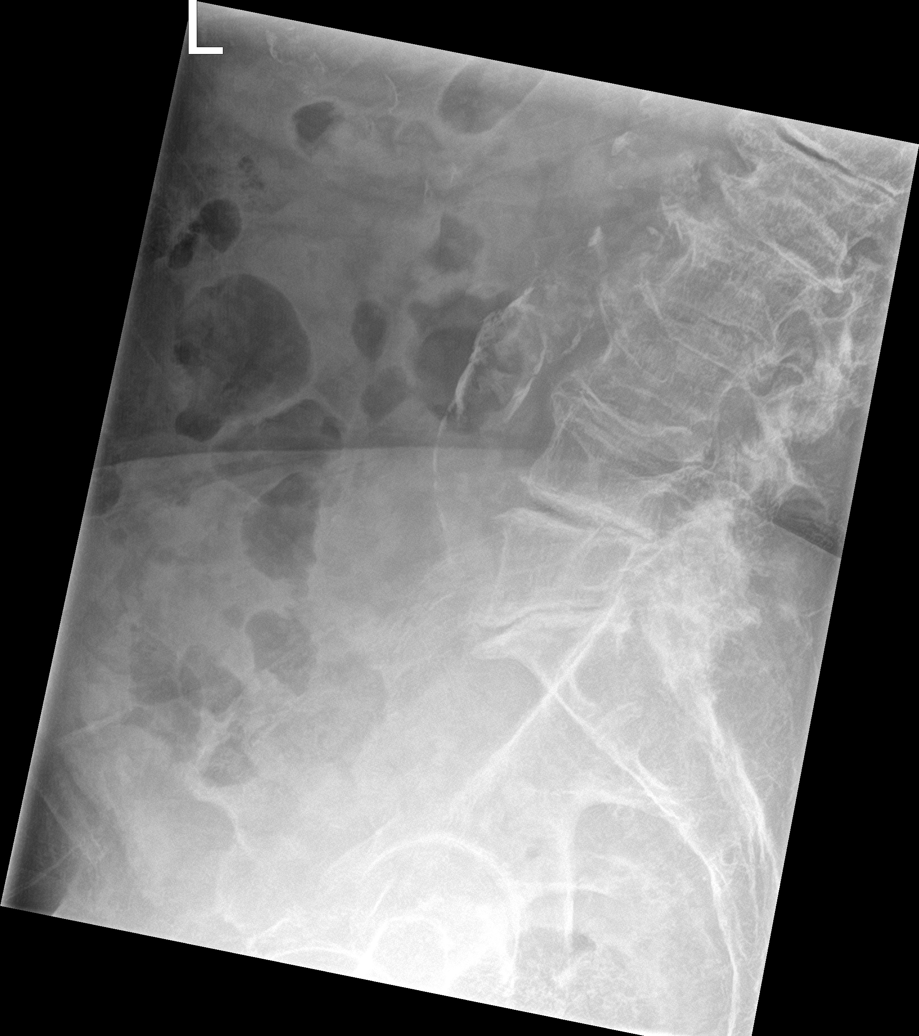

[5 of 5 positions shown; findings below may reference images not displayed]

FINDINGS: No acute abnormality is seen. The patient has severe multilevel
degenerative disc disease and facet arthropathy. Convex left
scoliosis is noted. Aortic atherosclerosis is seen.
IMPRESSION: No acute abnormality.

Severe multilevel spondylosis.

Atherosclerosis.

## 2017-07-01 ENCOUNTER — Encounter: Payer: Self-pay | Admitting: Podiatry

## 2017-07-01 ENCOUNTER — Ambulatory Visit (INDEPENDENT_AMBULATORY_CARE_PROVIDER_SITE_OTHER): Payer: Medicare Other | Admitting: Podiatry

## 2017-07-01 DIAGNOSIS — M79676 Pain in unspecified toe(s): Secondary | ICD-10-CM | POA: Diagnosis not present

## 2017-07-01 DIAGNOSIS — B351 Tinea unguium: Secondary | ICD-10-CM | POA: Diagnosis not present

## 2017-07-01 NOTE — Progress Notes (Signed)
Patient ID: Diana Peck, female   DOB: 12/19/1918, 84 y.o.   MRN: [id number] Complaint:  Visit Type: Patient returns to my office for continued preventative foot care services. Complaint: Patient states" my nails have grown long and thick and become painful to walk and wear shoes" . The patient presents for preventative foot care services. No changes to ROS  Podiatric Exam: Vascular: dorsalis pedis and posterior tibial pulses are palpable bilateral. Capillary return is immediate. Temperature gradient is WNL. Skin turgor WNL  Sensorium: Normal Semmes Weinstein monofilament test. Normal tactile sensation bilaterally. Nail Exam: Pt has thick disfigured discolored nails with subungual debris noted bilateral entire nail hallux through fifth toenails Ulcer Exam: There is no evidence of ulcer or pre-ulcerative changes or infection. Orthopedic Exam: Muscle tone and strength are WNL. No limitations in general ROM. No crepitus or effusions noted. Foot type and digits show no abnormalities. Bony prominences are unremarkable. Hammer toe contracture both feet. Skin: No Porokeratosis. No infection or ulcers  Diagnosis:  Onychomycosis, , Pain in right toe, pain in left toes  Treatment & Plan Procedures and Treatment: Consent by patient was obtained for treatment procedures. The patient understood the discussion of treatment and procedures well. All questions were answered thoroughly reviewed. Debridement of mycotic and hypertrophic toenails, 1 through 5 bilateral and clearing of subungual debris. No ulceration, no infection noted.  Return Visit-Office Procedure: Patient instructed to return to the office for a follow up visit 3 months for continued evaluation and treatment.  Raelan Burgoon DPM 

## 2017-09-30 ENCOUNTER — Ambulatory Visit (INDEPENDENT_AMBULATORY_CARE_PROVIDER_SITE_OTHER): Payer: Medicare Other | Admitting: Podiatry

## 2017-09-30 ENCOUNTER — Encounter: Payer: Self-pay | Admitting: Podiatry

## 2017-09-30 DIAGNOSIS — B351 Tinea unguium: Secondary | ICD-10-CM

## 2017-09-30 DIAGNOSIS — M79676 Pain in unspecified toe(s): Secondary | ICD-10-CM

## 2017-09-30 NOTE — Progress Notes (Signed)
Patient ID: Diana Peck R Winbush, female   DOB: Apr 03, 1918, 84 y.o.   MRN: [id number] Complaint:  Visit Type: Patient returns to my office for continued preventative foot care services. Complaint: Patient states" my nails have grown long and thick and become painful to walk and wear shoes" . The patient presents for preventative foot care services. No changes to ROS  Podiatric Exam: Vascular: dorsalis pedis and posterior tibial pulses are palpable bilateral. Capillary return is immediate. Temperature gradient is WNL. Skin turgor WNL  Sensorium: Normal Semmes Weinstein monofilament test. Normal tactile sensation bilaterally. Nail Exam: Pt has thick disfigured discolored nails with subungual debris noted bilateral entire nail hallux through fifth toenails Ulcer Exam: There is no evidence of ulcer or pre-ulcerative changes or infection. Orthopedic Exam: Muscle tone and strength are WNL. No limitations in general ROM. No crepitus or effusions noted. Foot type and digits show no abnormalities. Bony prominences are unremarkable. Hammer toe contracture both feet. Skin: No Porokeratosis. No infection or ulcers  Diagnosis:  Onychomycosis, , Pain in right toe, pain in left toes  Treatment & Plan Procedures and Treatment: Consent by patient was obtained for treatment procedures. The patient understood the discussion of treatment and procedures well. All questions were answered thoroughly reviewed. Debridement of mycotic and hypertrophic toenails, 1 through 5 bilateral and clearing of subungual debris. No ulceration, no infection noted.  Return Visit-Office Procedure: Patient instructed to return to the office for a follow up visit 3 months for continued evaluation and treatment.  Helane GuntherGregory Winda Summerall DPM

## 2017-11-11 ENCOUNTER — Ambulatory Visit
Admission: RE | Admit: 2017-11-11 | Discharge: 2017-11-11 | Disposition: A | Discharge status: Home / Self Care | Payer: Medicare Other | Source: Ambulatory Visit | Attending: Internal Medicine | Admitting: Internal Medicine

## 2017-11-11 ENCOUNTER — Other Ambulatory Visit: Payer: Self-pay | Admitting: Internal Medicine

## 2017-11-11 DIAGNOSIS — R0602 Shortness of breath: Secondary | ICD-10-CM

## 2017-12-22 ENCOUNTER — Ambulatory Visit: Payer: Medicare Other | Admitting: Podiatry

## 2017-12-30 ENCOUNTER — Ambulatory Visit: Payer: Medicare Other | Admitting: Podiatry

## 2019-02-22 ENCOUNTER — Ambulatory Visit: Payer: Medicare Other

## 2019-08-09 IMAGING — CR DG CHEST 2V
2 series · 2 of 2 positions shown · non-contrast
Comparison: 07/25/2015

CLINICAL DATA: Shortness of breath

EXAM:
CHEST - 2 VIEW

[w chest pa]
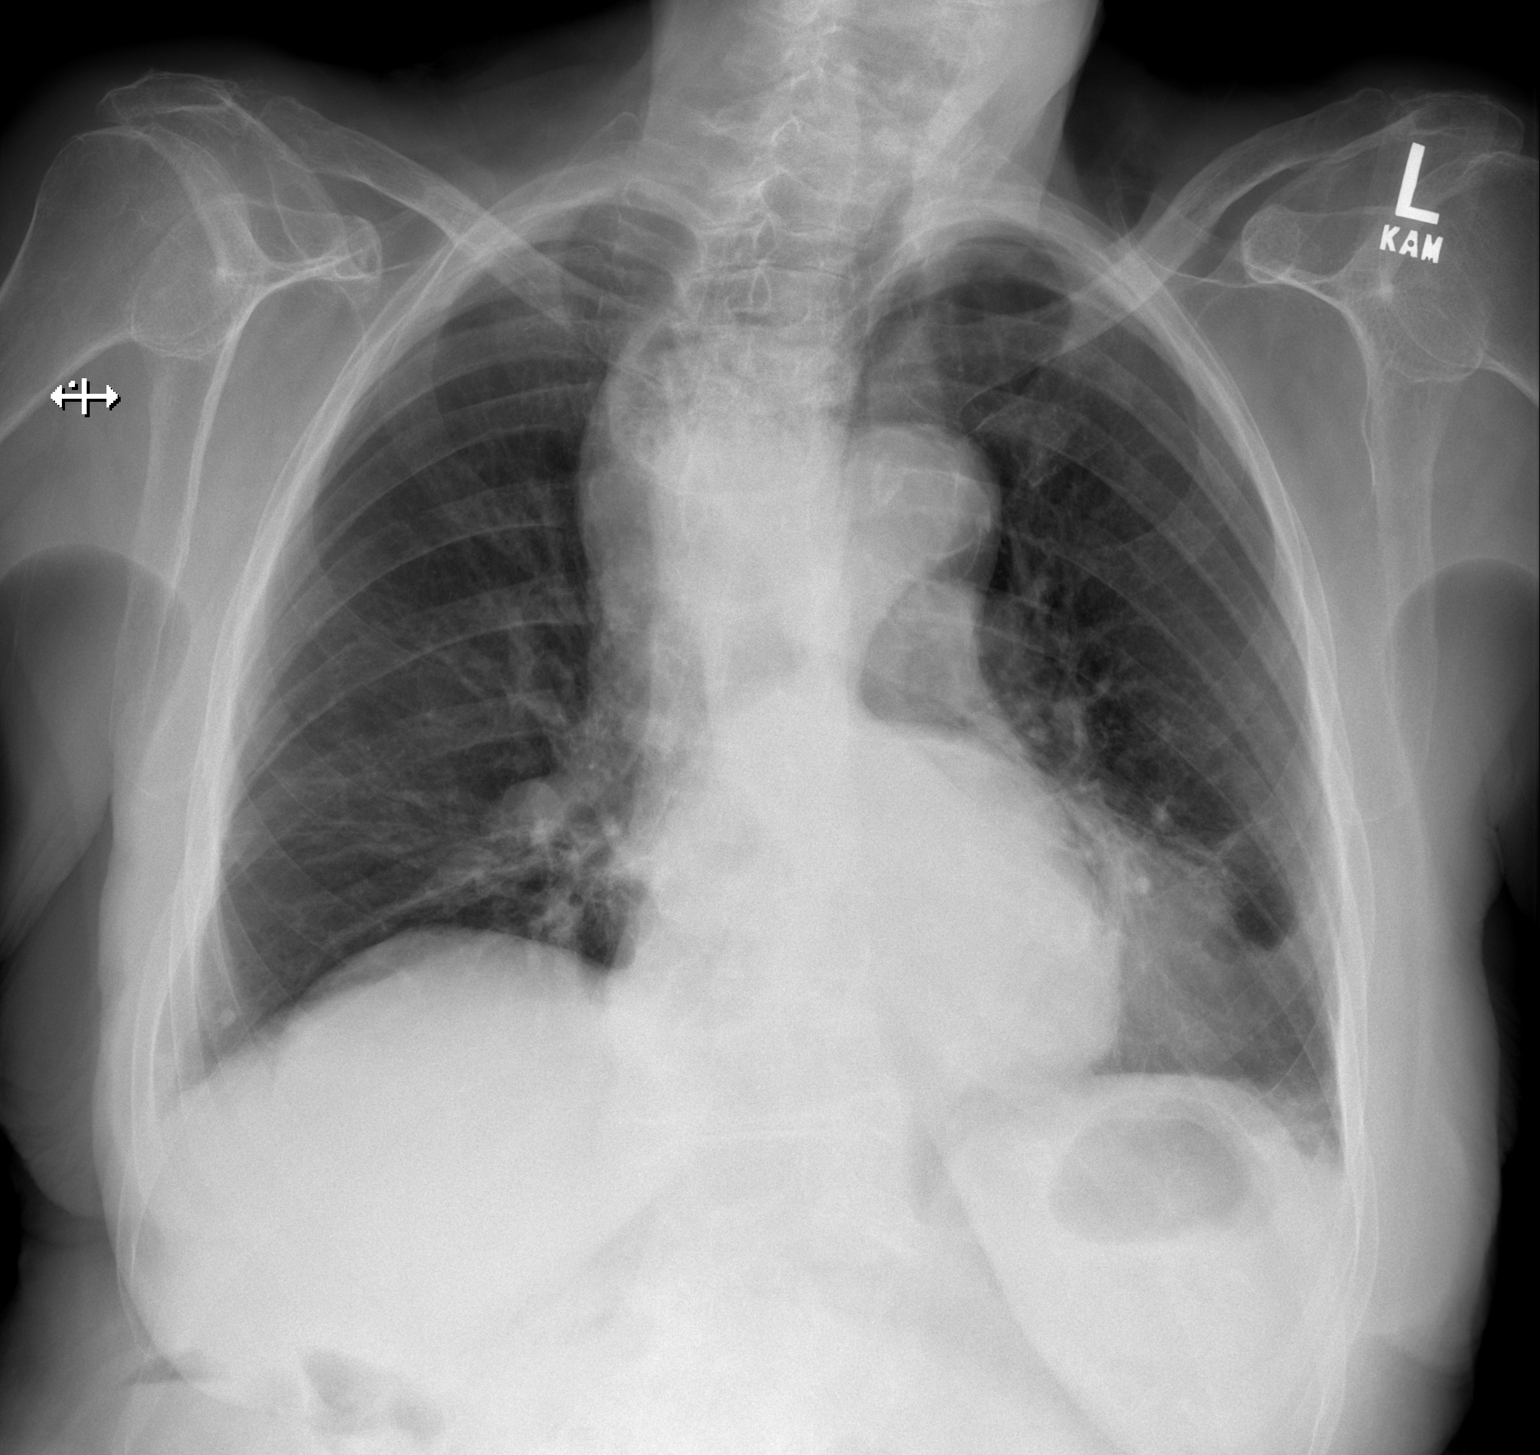

[w chest lat]
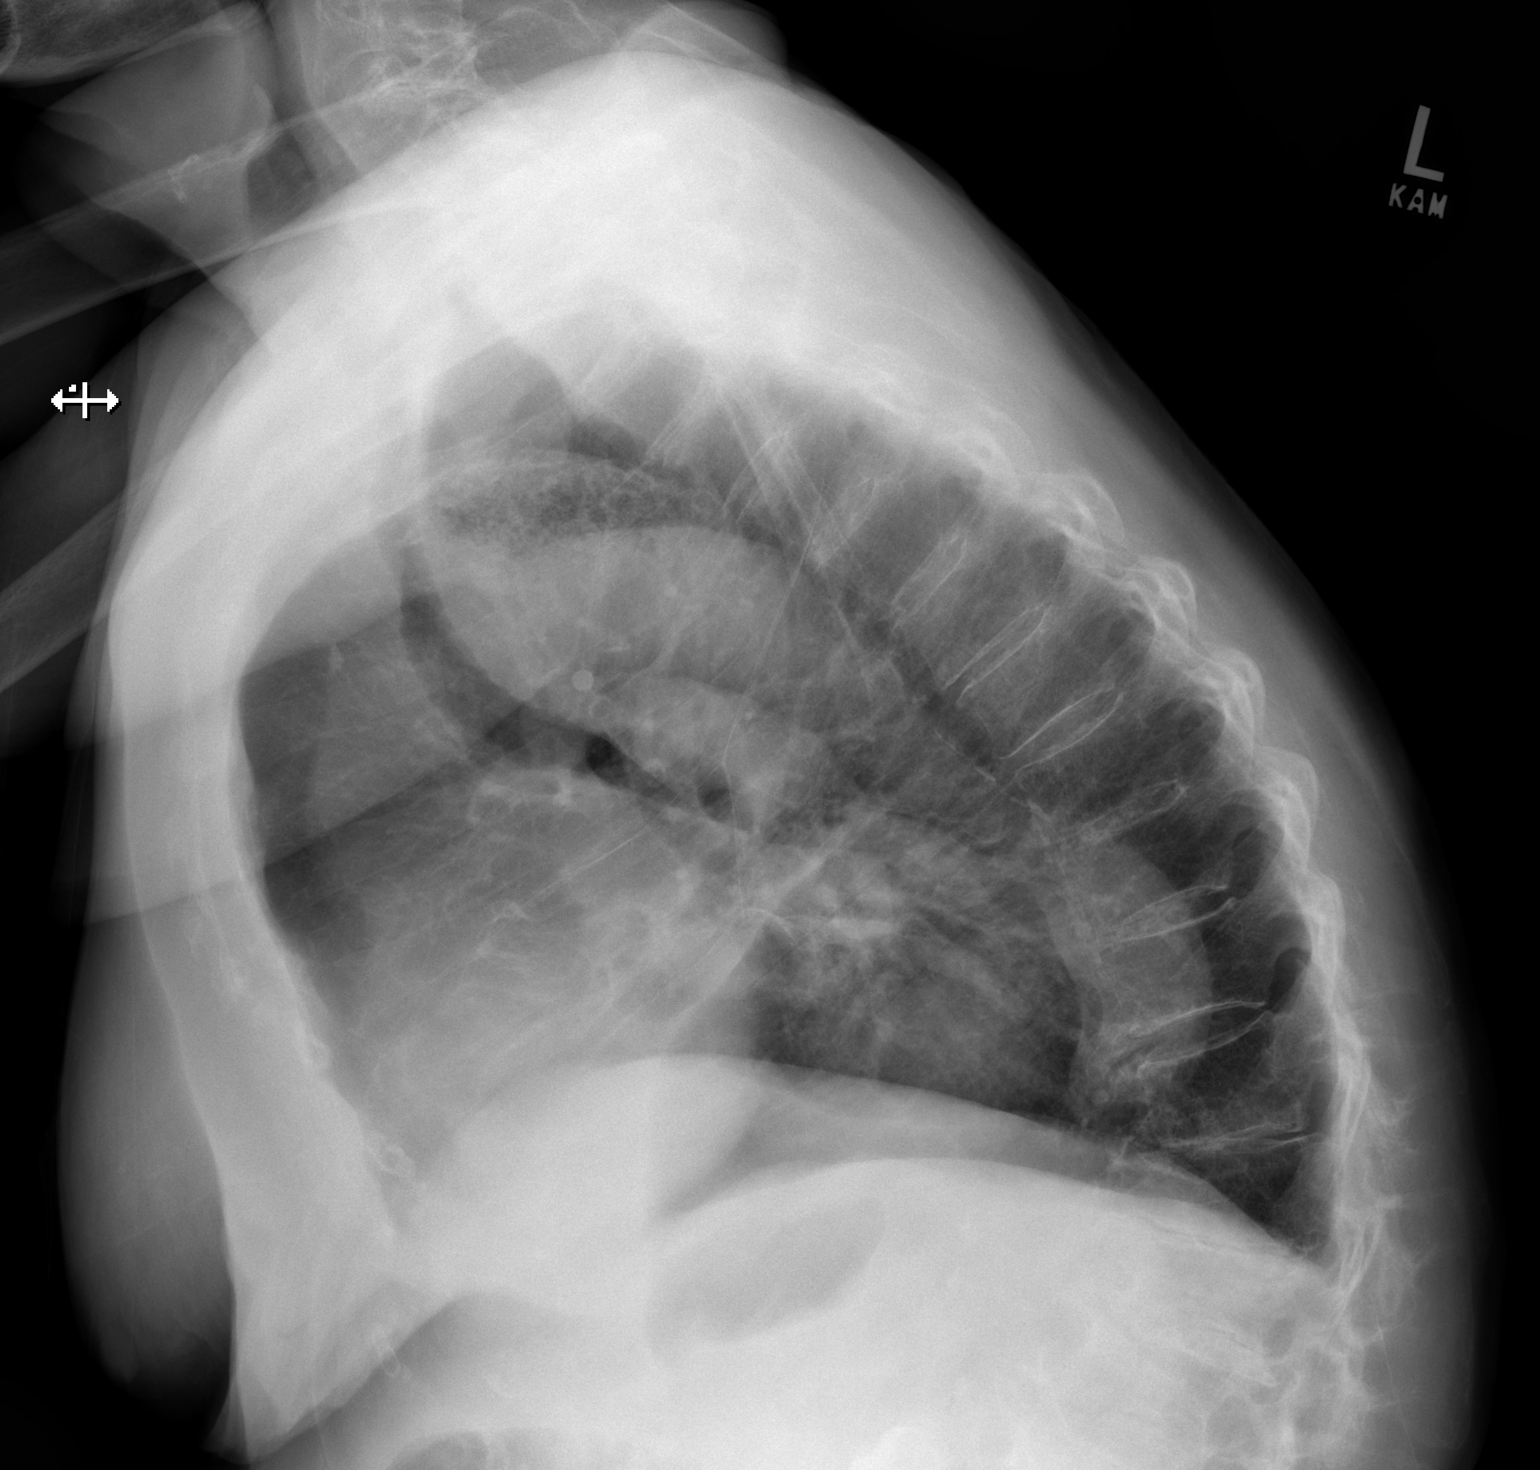

[2 of 2 positions shown; findings below may reference images not displayed]

FINDINGS: Cardiac shadow is stable in appearance. Changes consistent with
hiatal hernia are noted. Stable aortic calcifications are noted. The
proximal esophagus is again dilated with retained food stuffs
suspicious for distal narrowing or obstructive process. No focal
infiltrate is seen. Degenerative changes of the thoracic spine are
noted.
IMPRESSION: Persistent dilated esophagus with retained food stuffs proximally.
This again raises suspicion for distal narrowing or dysphagia. The
need for further evaluation can be determined on a clinical basis.

No acute abnormality is noted.

## 2021-10-04 DEATH — deceased
